# Patient Record
Sex: Male | Born: 1985 | Race: White | Hispanic: No | Marital: Single | Smoking: Current every day smoker
Health system: Southern US, Community
[De-identification: ages and names within clinical notes are randomized; demographics above are authoritative.]

---

## 2014-08-03 ENCOUNTER — Emergency Department (HOSPITAL_COMMUNITY): Payer: Self-pay

## 2014-08-03 ENCOUNTER — Encounter (HOSPITAL_COMMUNITY): Payer: Self-pay | Admitting: Emergency Medicine

## 2014-08-03 ENCOUNTER — Emergency Department (HOSPITAL_COMMUNITY)
Admission: EM | Admit: 2014-08-03 | Discharge: 2014-08-03 | Disposition: A | Discharge status: Home / Self Care | Payer: Self-pay | Attending: Emergency Medicine | Admitting: Emergency Medicine

## 2014-08-03 DIAGNOSIS — S301XXA Contusion of abdominal wall, initial encounter: Secondary | ICD-10-CM | POA: Insufficient documentation

## 2014-08-03 DIAGNOSIS — S3981XA Other specified injuries of abdomen, initial encounter: Secondary | ICD-10-CM | POA: Insufficient documentation

## 2014-08-03 DIAGNOSIS — S99929A Unspecified injury of unspecified foot, initial encounter: Secondary | ICD-10-CM

## 2014-08-03 DIAGNOSIS — S99919A Unspecified injury of unspecified ankle, initial encounter: Secondary | ICD-10-CM

## 2014-08-03 DIAGNOSIS — M546 Pain in thoracic spine: Secondary | ICD-10-CM

## 2014-08-03 DIAGNOSIS — Y9289 Other specified places as the place of occurrence of the external cause: Secondary | ICD-10-CM | POA: Insufficient documentation

## 2014-08-03 DIAGNOSIS — F17200 Nicotine dependence, unspecified, uncomplicated: Secondary | ICD-10-CM | POA: Insufficient documentation

## 2014-08-03 DIAGNOSIS — W1789XA Other fall from one level to another, initial encounter: Secondary | ICD-10-CM | POA: Insufficient documentation

## 2014-08-03 DIAGNOSIS — IMO0002 Reserved for concepts with insufficient information to code with codable children: Secondary | ICD-10-CM | POA: Insufficient documentation

## 2014-08-03 DIAGNOSIS — S8990XA Unspecified injury of unspecified lower leg, initial encounter: Secondary | ICD-10-CM | POA: Insufficient documentation

## 2014-08-03 DIAGNOSIS — Y9389 Activity, other specified: Secondary | ICD-10-CM | POA: Insufficient documentation

## 2014-08-03 MED ORDER — IOHEXOL 300 MG/ML  SOLN
100.0000 mL | Freq: Once | INTRAMUSCULAR | Status: AC | PRN
  Administered 2014-08-03: 100 mL via INTRAVENOUS

## 2014-08-03 MED ORDER — NAPROXEN 500 MG PO TABS: 500.0000 mg | ORAL_TABLET | Freq: Two times a day (BID) | ORAL | Status: AC

## 2014-08-03 MED ORDER — HYDROCODONE-ACETAMINOPHEN 5-325 MG PO TABS: 2.0000 | ORAL_TABLET | ORAL | Status: AC | PRN

## 2014-08-03 MED ORDER — HYDROMORPHONE HCL PF 1 MG/ML IJ SOLN
1.0000 mg | Freq: Once | INTRAMUSCULAR | Status: AC
  Administered 2014-08-03: 1 mg via INTRAVENOUS
  Filled 2014-08-03: qty 1

## 2014-08-03 NOTE — Discharge Instructions (Signed)
Please call your doctor for a followup appointment within 24-48 hours. When you talk to your doctor please let them know that you were seen in the emergency department and have them acquire all of your records so that they can discuss the findings with you and formulate a treatment plan to fully care for your new and ongoing problems. ° ° °Emergency Department Resource Guide °1) Find a Doctor and Pay Out of Pocket °Although you won't have to find out who is covered by your insurance plan, it is a good idea to ask around and get recommendations. You will then need to call the office and see if the doctor you have chosen will accept you as a new patient and what types of options they offer for patients who are self-pay. Some doctors offer discounts or will set up payment plans for their patients who do not have insurance, but you will need to ask so you aren't surprised when you get to your appointment. ° °2) Contact Your Local Health Department °Not all health departments have doctors that can see patients for sick visits, but many do, so it is worth a call to see if yours does. If you don't know where your local health department is, you can check in your phone book. The CDC also has a tool to help you locate your state's health department, and many state websites also have listings of all of their local health departments. ° °3) Find a Walk-in Clinic °If your illness is not likely to be very severe or complicated, you may want to try a walk in clinic. These are popping up all over the country in pharmacies, drugstores, and shopping centers. They're usually staffed by nurse practitioners or physician assistants that have been trained to treat common illnesses and complaints. They're usually fairly quick and inexpensive. However, if you have serious medical issues or chronic medical problems, these are probably not your best option. ° °No Primary Care Doctor: °- Call [facility] at  832-8000 - they can help you  locate a primary care doctor that  accepts your insurance, provides certain services, etc. °- Physician Referral Service- 1-800-533-3463 ° °Chronic Pain Problems: °Organization         Address  Phone   Notes  °[facility]  ([phone]) [phone] Patients need to be referred by their primary care doctor.  ° °Medication Assistance: °Organization         Address  Phone   Notes  °[place] [facility] [address] °[place], [place] [address] ([phone]) 641-8030 --Must be a resident of [place] °-- Must have NO insurance coverage whatsoever (no Medicaid/ Medicare, etc.) °-- The pt. MUST have a primary care doctor that directs their care regularly and follows them in the community °  °[facility]  (866) 331-1348   °[employer]  (888) 892-1162   ° °Agencies that provide inexpensive medical care: °Organization         Address  Phone   Notes  °[facility]  ([phone]) [phone]   °[facility]    (336) 832-7272   °[facility] [address] °[place], [place] [address] (336) [phone]   °[facility] [address], °[place] (336) [phone]   °[facility]    (336) 373-0678   °[facility]    (336) 272-1050   °[facility] ° [address], [place] Phone:  (336)   832-4444, Fax:  (336) [phone] Hours of Operation:  9 am - 6 pm, M-F.  Also accepts Medicaid/Medicare and self-pay.  °[facility] ° [address], [place] Phone: (336) [phone], Fax: (336) [phone]. Hours of Operation:  8:30 am - 5:30 pm, M-F.  Also accepts Medicaid and self-pay.  °[facility] [place] [address], [place] Phone: (336) [phone]   °[facility] [address], [place], [place] (336)[phone], Ext. 123 Mondays & Thursdays: 7-9 AM.  First 15 patients are seen on a first come, first serve basis. °  ° °Medicaid-accepting [place]  Providers: ° °Organization         Address  Phone   Notes  °[facility] [address], [place] ([phone]) [phone] Also accepts self-pay patients.  °[facility] [address], [place] ° (336) [phone]   °[facility] [address], [place] (336) [phone]   °[facility] [address] [place] [address], [place] (336) [phone]   °[facility] [address], [place]  ° ([phone]) [phone] Only accepts [place] Access Medicaid patients after they have their name applied to their card.  ° °Self-Pay (no insurance) in [place]: ° °Organization         Address  Phone   Notes  °Sickle Cell Patients, [facility] [address], [place] (336) [phone]   °[facility] [address], [place] (336) [phone]   °[facility] ° [address],  ([phone]) [phone]   °[facility]/Dr. Osei-Bonsu ° [address] [place] [address], [place] or [address], [place] ([phone]) [phone] Phone number for both [place] and [place] locations is the same.  °[facility] [address], [place] ([phone]) [phone]   °[facility] [address] [place] [address], [place] or [address] (336) [phone] °(336) [phone]   °[facility] [address], [place] (336) 350-1642, phone; (336) [phone], fax Sees patients 1st and 3rd Saturday of every month.  Must not qualify for public or private insurance (i.e. Medicaid, Medicare, [place] Health Choice, Veterans' Benefits) • Household income should be no more than 200% of the poverty level •The clinic cannot treat you if you are pregnant or think you are pregnant • Sexually transmitted diseases are not treated at the clinic.  ° ° °Dental Care: °Organization         Address  Phone  Notes  °[facility] [address], [place] (336) [phone] Accepts children up to age 21 who are enrolled in Medicaid or [place] Health Choice; pregnant women with a Medicaid card; and children who have applied for Medicaid or [place] Health Choice, but were declined, whose parents can pay a reduced fee at time of service.  °[facility] [place]  [address], [place] (336) 641-7733 Accepts children up to age 21 who are enrolled in Medicaid or  Health Choice; pregnant women with a Medicaid card; and children who have applied for Medicaid or  Health Choice, but were declined, whose parents can pay a reduced fee at time of service.  °[facility] ° [address], [place] ([phone]) [phone] Patients are seen by appointment only. Walk-ins are   not accepted. [facility] will see patients 38 years of age and older. Monday - Tuesday (8am-5pm) Most Wednesdays (8:30-5pm) $30 per visit, cash only  [facility]  [address], [facility] [phone] Patients are seen by appointment only. Walk-ins are not accepted. [facility] will see patients 30 years of age and older. One Wednesday Evening (Monthly: Volunteer Based).  $30 per visit, cash only  [employer] [facility] [employer]  [phone] for adults; Children under age 27, call Graduate Pediatric Dentistry at [phone]. Children aged 7-14, please call [phone] to request a pediatric application.  Dental services are provided in all areas of dental care including fillings, crowns and bridges, complete and partial dentures, implants, gum treatment, root canals, and extractions. Preventive care is also provided. Treatment is provided to both adults and children. Patients are selected via a lottery and there is often a waiting list.   [facility] [address] [place]  [phone] [email or web address]   [facility]  [address] [place], [place] [phone], Ext. [phone] Second and Fourth Thursday of each month, opens at 6:30 AM; Clinic ends at 9 AM.  Patients are seen on a first-come first-served basis, and a limited number are seen during each clinic.   [facility]  [address] [place], [place] [phone]   Eligibility Requirements You must have lived in [place], [place], or [place] counties for at least the last three months.   You cannot be eligible for state or federal sponsored [place], including [employer], [place], or [employer].   You generally cannot be eligible for healthcare insurance through your employer.    How to apply: Eligibility screenings are held every Tuesday and Wednesday afternoon from 1:00 pm until 4:00 pm. You do not need an appointment for the interview!  [facility] [address] [place], [place] [phone]   [facility]  [phone]   [facility]  [phone]   [facility]  [phone]

## 2014-08-03 NOTE — ED Notes (Signed)
Patient transported to CT 

## 2014-08-03 NOTE — ED Provider Notes (Signed)
CSN: [id number]     Arrival date & time 08/03/14  1922 History   First MD Initiated Contact with Patient 08/03/14 1933     Chief Complaint  Patient presents with  . Fall     (Consider location/radiation/quality/duration/timing/severity/associated sxs/prior Treatment) HPI Comments: 28 year old male, states that while he was cutting down a tree from approximately 12 feet up in a tree, it started to fall forward, he fell with it and fell to the ground on top of the tree. He subsequently developed acute onset of mid to lower back pain as well as right lower abdominal pain and bilateral leg pain. He denies head injury, neck pain, numbness, weakness, chest pain, shortness of breath, cough. Paramedics transported the patient with cervical collar, backboard immobilized. He does report drinking several beers earlier in the afternoon. Pain is 9/10, constant. He denies any allergies medical problems or prior surgeries  Patient is a 28 y.o. male presenting with fall. The history is provided by the patient.  Fall    History reviewed. No pertinent past medical history. History reviewed. No pertinent past surgical history. History reviewed. No pertinent family history. History  Substance Use Topics  . Smoking status: Current Every Day Smoker -- 0.50 packs/day for 10 years    Types: Cigarettes  . Smokeless tobacco: Never Used  . Alcohol Use: Yes    Review of Systems  All other systems reviewed and are negative.     Allergies  Review of patient's allergies indicates no known allergies.  Home Medications   Prior to Admission medications   Medication Sig Start Date End Date Taking? Authorizing Provider  HYDROcodone-acetaminophen (NORCO/VICODIN) 5-325 MG per tablet Take 2 tablets by mouth every 4 (four) hours as needed. 08/03/14   Vida Roller, MD  naproxen (NAPROSYN) 500 MG tablet Take 1 tablet (500 mg total) by mouth 2 (two) times daily with a meal. 08/03/14   Vida Roller, MD   BP 113/71   Pulse 90  Temp(Src) 98.1 F (36.7 C) (Oral)  Resp 18  Ht  (1.727 m)  Wt 160 lb (72.576 kg)  BMI 24.33 kg/m2  SpO2 98% Physical Exam  Nursing note and vitals reviewed. Constitutional: He appears well-developed and well-nourished.  Uncomfortable appearing  HENT:  Head: Normocephalic and atraumatic.  Mouth/Throat: Oropharynx is clear and moist. No oropharyngeal exudate.  no facial tenderness, deformity, malocclusion or hemotympanum.  no battle's sign or racoon eyes.   Eyes: Conjunctivae and EOM are normal. Pupils are equal, round, and reactive to light. Right eye exhibits no discharge. Left eye exhibits no discharge. No scleral icterus.  Neck: Normal range of motion. Neck supple. No JVD present. No thyromegaly present.  Cardiovascular: Normal rate, regular rhythm, normal heart sounds and intact distal pulses.  Exam reveals no gallop and no friction rub.   No murmur heard. Pulmonary/Chest: Effort normal and breath sounds normal. No respiratory distress. He has no wheezes. He has no rales. He exhibits no tenderness.  Abdominal: Soft. Bowel sounds are normal. He exhibits no distension and no mass. There is tenderness (tenderness over the lower abdomen on the right).  No signs of contusion to the abdominal wall, no bruising, no lacerations  Musculoskeletal: Normal range of motion. He exhibits tenderness ( ttp over the lower thoracic spine.  no c/l ttp). He exhibits no edema.  Full range of motion of all extremities without deformity, tenderness. Supple joints and soft compartments diffusely  Lymphadenopathy:    He has no cervical adenopathy.  Neurological:  He is alert. Coordination normal.  Normal level of consciousness, normal mentation, cranial nerves III through XII intact, moves all extremities x4 with normal strength and sensation. Normal coordination.  Skin: Skin is warm and dry.  Psychiatric: He has a normal mood and affect. His behavior is normal.    ED Course  Procedures  (including critical care time) Labs Review Labs Reviewed - No data to display  Imaging Review Ct Abdomen Pelvis W Contrast  08/03/2014   CLINICAL DATA:  Trauma, fell out of tree.  Right-sided pain.  EXAM: CT ABDOMEN AND PELVIS WITH CONTRAST  TECHNIQUE: Multidetector CT imaging of the abdomen and pelvis was performed using the standard protocol following bolus administration of intravenous contrast.  CONTRAST:  OMNIPAQUE IOHEXOL 300 MG/ML  SOLN  COMPARISON:  None.  FINDINGS: Lung bases are within normal.  Abdominal images demonstrate a normal liver, spleen, pancreas, gallbladder, adrenal glands and kidneys. Appendix is normal. Vascular structures are within normal. There is no free fluid or free peritoneal air.  Pelvic images demonstrate a normal rectum, bladder and prostate.  Bones and soft tissues are within normal without evidence of fracture.  IMPRESSION: No acute findings in the abdomen or pelvis.   Electronically Signed   By: Elberta Fortis M.D.   On: 08/03/2014 20:28      MDM   Final diagnoses:  Midline thoracic back pain  Contusion of abdominal wall, initial encounter     the patient will need evaluation for lower back fracture as well as intra-abdominal injury given his tenderness. CT scan of the abdomen and pelvis should suffice given the location of his lower thoracic tenderness. No neurologic abnormalities, pain to be controlled with medications as below.  CT scan of the abdomen and pelvis is negative for any acute intra-abdominal findings, no lower thoracic or lumbar fractures seen as well. Pain medications given with improvement, ambulated without difficulty, prescriptions as below. Patient informed of results and understanding expressed  Meds given in ED:  Medications  HYDROmorphone (DILAUDID) injection 1 mg (1 mg Intravenous Given 08/03/14 1954)  iohexol (OMNIPAQUE) 300 MG/ML solution 100 mL (100 mLs Intravenous Contrast Given 08/03/14 2002)    New Prescriptions    HYDROCODONE-ACETAMINOPHEN (NORCO/VICODIN) 5-325 MG PER TABLET    Take 2 tablets by mouth every 4 (four) hours as needed.   NAPROXEN (NAPROSYN) 500 MG TABLET    Take 1 tablet (500 mg total) by mouth 2 (two) times daily with a meal.        Vida Roller, MD 08/03/14 2040

## 2014-08-03 NOTE — ED Notes (Addendum)
Pt was in a tree cutting branches when limb got caught in harness and he fell approximately 10-12 feet to the ground. States he landed on his back. Denies LOC or hitting head. Does report having 2-3 beers. Pt alert and oriented x 4, neuro intact. Complaining of lower mid and right back pain 9/10.

## 2015-09-21 IMAGING — CT CT ABD-PELV W/ CM
2 of 5 series · 4 of 46 positions shown, 6 images · IV contrast (Iodine)
Comparison: None.

CLINICAL DATA: Trauma, fell out of tree.  Right-sided pain.

EXAM:
CT ABDOMEN AND PELVIS WITH CONTRAST
TECHNIQUE: Multidetector CT imaging of the abdomen and pelvis was performed
using the standard protocol following bolus administration of
intravenous contrast.
CONTRAST:  100mL OMNIPAQUE IOHEXOL 300 MG/ML  SOLN

[Series 204: cor · coronal · 0.50mm/px · 3 of 105 slices shown, 4 images]
[im 24/105  soft-tissue]
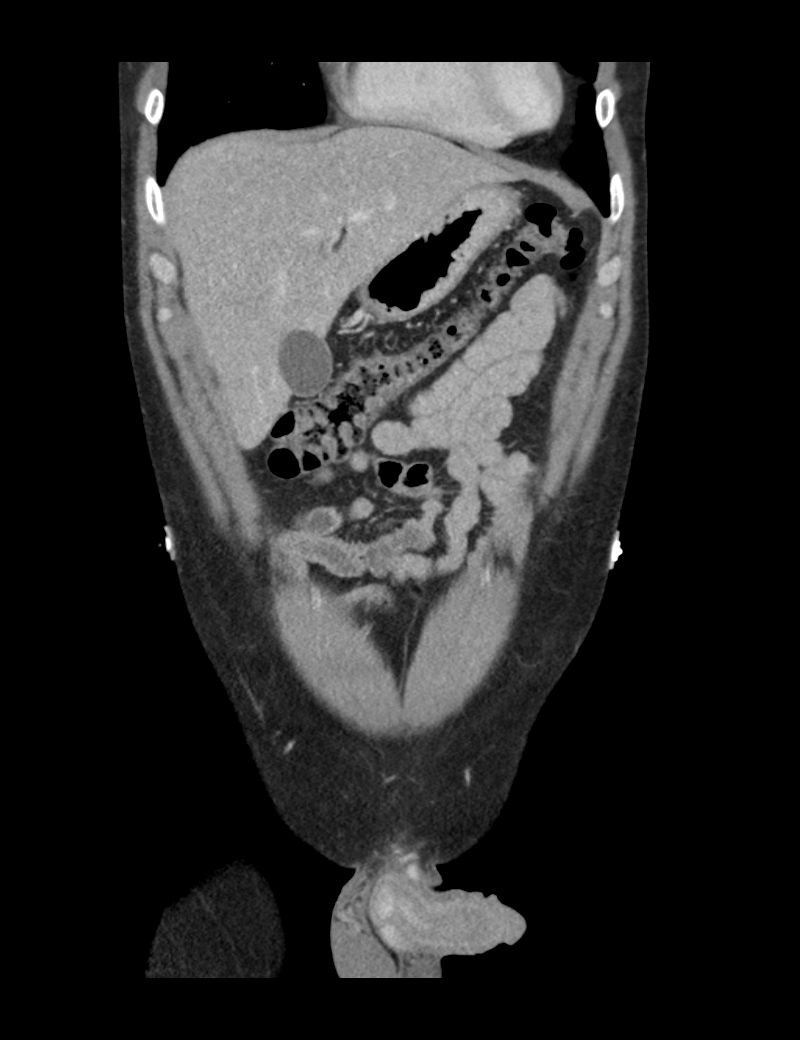
[im 24/105  bone]
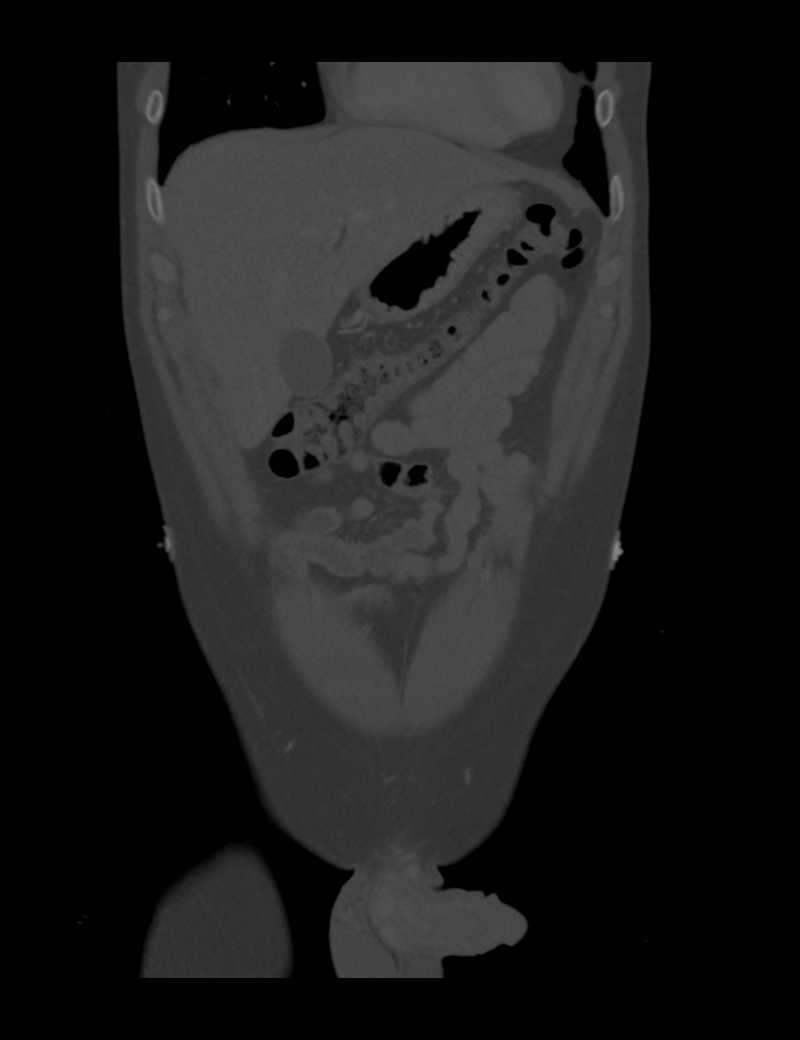
[im 58/105  soft-tissue]
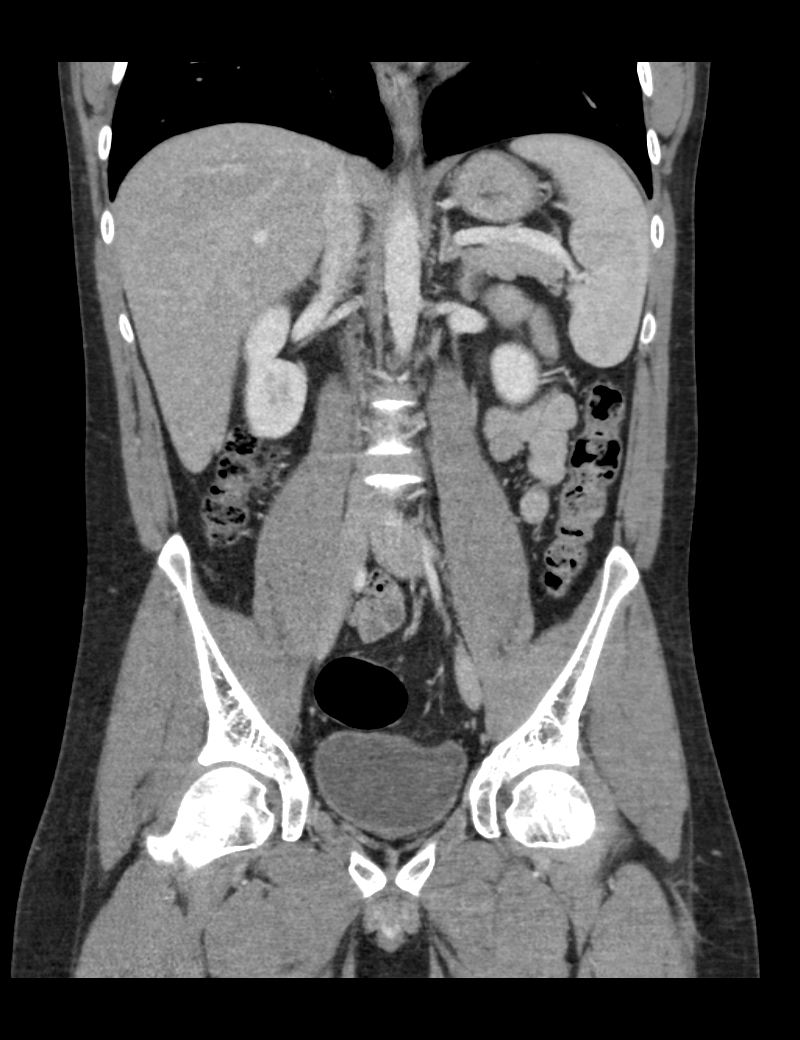
[im 81/105  soft-tissue]
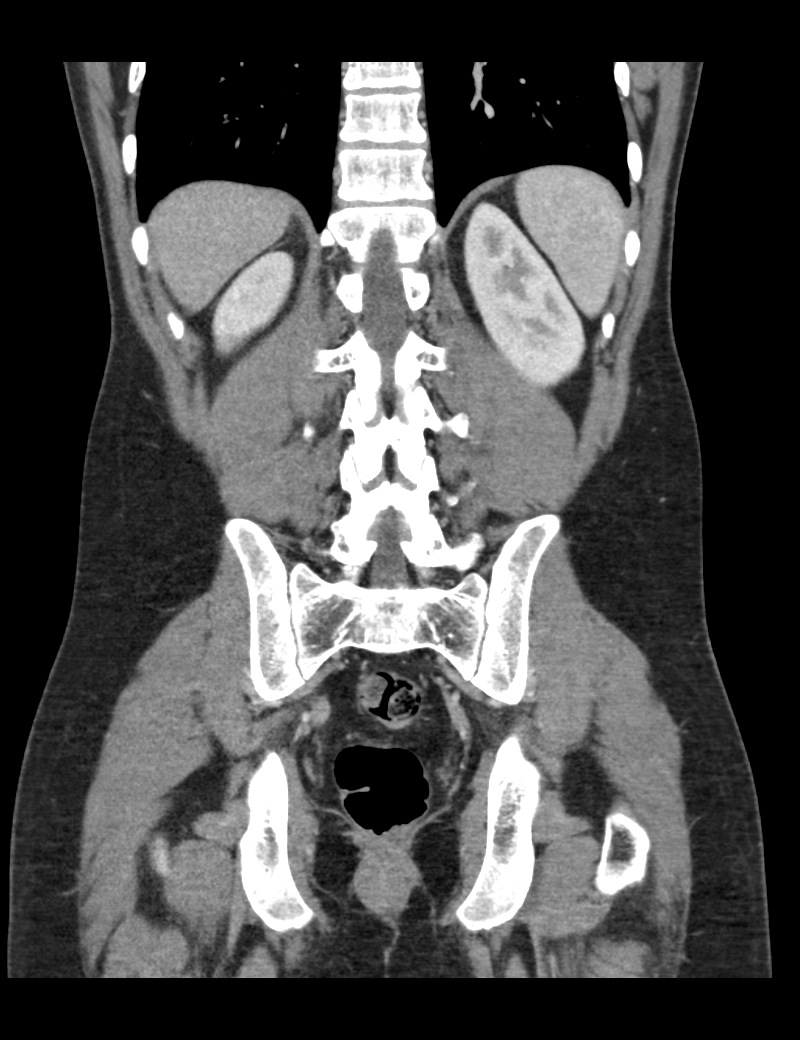

[Series 205: sag · sagittal · 0.50mm/px · 1 of 147 slices shown, 2 images]
[im 49/147  soft-tissue]
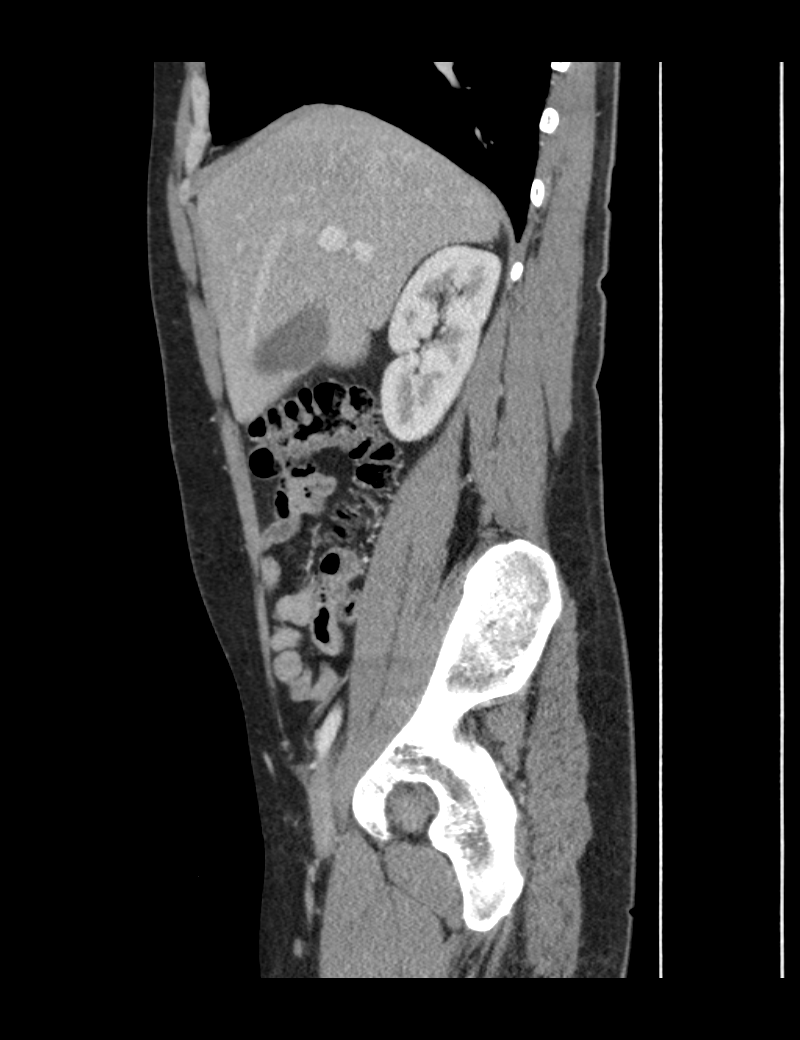
[im 49/147  bone]
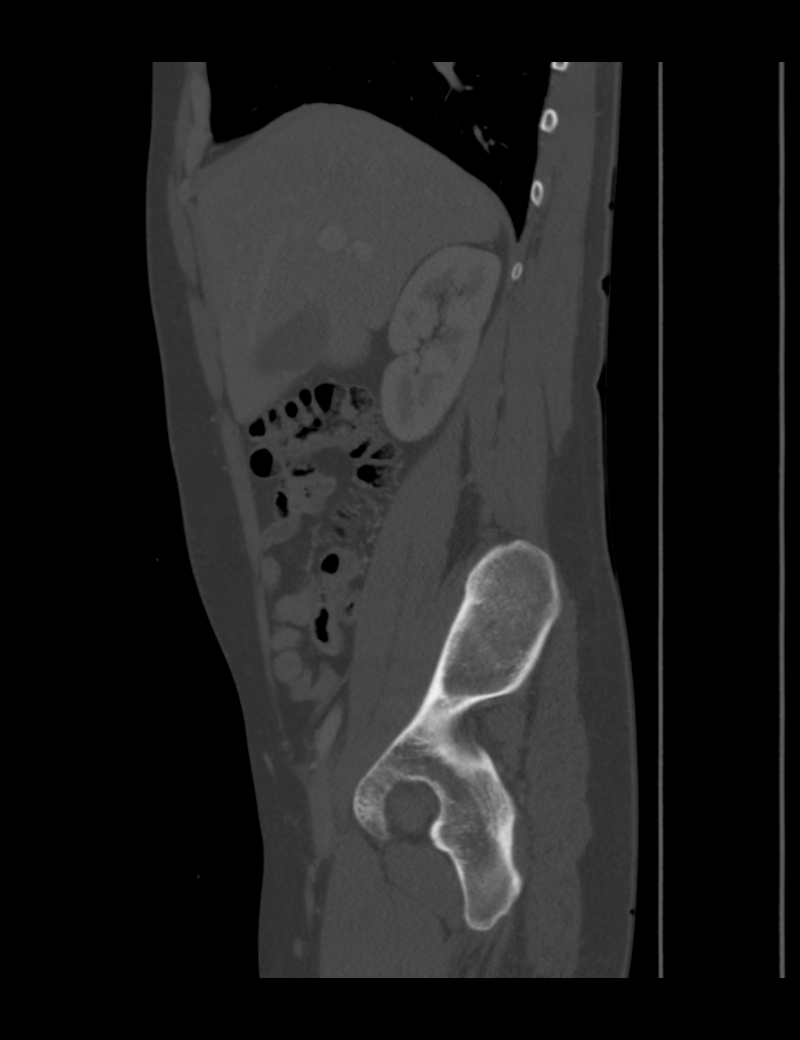

[4 of 46 positions shown; findings below may reference images not displayed]

FINDINGS: Lung bases are within normal.

Abdominal images demonstrate a normal liver, spleen, pancreas,
gallbladder, adrenal glands and kidneys. Appendix is normal.
Vascular structures are within normal. There is no free fluid or
free peritoneal air.

Pelvic images demonstrate a normal rectum, bladder and prostate.

Bones and soft tissues are within normal without evidence of
fracture.
IMPRESSION: No acute findings in the abdomen or pelvis.
